# Patient Record
Sex: Male | Born: 1965 | Race: White | Hispanic: No | Smoking: Former smoker
Health system: Southern US, Community
[De-identification: ages and names within clinical notes are randomized; demographics above are authoritative.]

## PROBLEM LIST (undated history)

## (undated) DIAGNOSIS — E785 Hyperlipidemia, unspecified: Secondary | ICD-10-CM

## (undated) DIAGNOSIS — E119 Type 2 diabetes mellitus without complications: Secondary | ICD-10-CM

## (undated) DIAGNOSIS — I219 Acute myocardial infarction, unspecified: Secondary | ICD-10-CM

## (undated) HISTORY — PX: CARDIAC SURGERY: SHX584

---

## 2017-09-27 ENCOUNTER — Other Ambulatory Visit: Payer: Self-pay

## 2017-09-27 ENCOUNTER — Ambulatory Visit (INDEPENDENT_AMBULATORY_CARE_PROVIDER_SITE_OTHER): Payer: Medicare Other

## 2017-09-27 ENCOUNTER — Encounter: Payer: Self-pay | Admitting: Emergency Medicine

## 2017-09-27 ENCOUNTER — Ambulatory Visit
Admission: EM | Admit: 2017-09-27 | Discharge: 2017-09-27 | Disposition: A | Discharge status: Home / Self Care | Payer: Medicare Other

## 2017-09-27 DIAGNOSIS — L03115 Cellulitis of right lower limb: Secondary | ICD-10-CM | POA: Diagnosis not present

## 2017-09-27 DIAGNOSIS — M25561 Pain in right knee: Secondary | ICD-10-CM

## 2017-09-27 DIAGNOSIS — W19XXXA Unspecified fall, initial encounter: Secondary | ICD-10-CM | POA: Diagnosis not present

## 2017-09-27 DIAGNOSIS — S8001XA Contusion of right knee, initial encounter: Secondary | ICD-10-CM

## 2017-09-27 HISTORY — DX: Acute myocardial infarction, unspecified: I21.9

## 2017-09-27 HISTORY — DX: Type 2 diabetes mellitus without complications: E11.9

## 2017-09-27 MED ORDER — MUPIROCIN 2 % EX OINT: TOPICAL_OINTMENT | CUTANEOUS | 0 refills | Status: AC

## 2017-09-27 MED ORDER — CEPHALEXIN 500 MG PO CAPS: 500.0000 mg | ORAL_CAPSULE | Freq: Four times a day (QID) | ORAL | 0 refills | Status: AC

## 2017-09-27 NOTE — ED Provider Notes (Addendum)
[facility] ____________________________________________  Time seen: Approximately 8:30 AM  I have reviewed the triage vital signs and the nursing notes.   HISTORY  Chief Complaint Knee Pain  HPI Eric Maldonado is a 52 y.o. male presenting for evaluation of right anterior knee pain that is been present since Monday after he tripped and fell forward.  Patient states when he landed he landed with his right knee on a pile of wood causing bruising and pain.  Reports has continued to remain ambulatory.  Has intermittently taken over-the-counter Tylenol which does help for pain.  Patient states that he had a few scratches to his right lower leg from when he hit the wood.  Denies any increase to his chronic lower leg swelling.  States right knee pain is mostly with direct palpation. States pain only to anterior knee, denies other leg pain.  Has continued to ambulate well.  Denies any sensation of knee giving out or instability.  States normally has a reddish color to bilateral lower legs.States that he knows he has arthritis to his left knee.  Denies pain radiation, paresthesias, acute leg swelling, dizziness, chest pain, shortness of breath, or other complaints.  Reports otherwise feels well.  Denies other aggravating or alleviating factors. Denies recent sickness. Denies recent antibiotic use.  Reports tetanus immunization is up-to-date. Denies history of MRSA.   Ofilia Neas, MD: PCP   Past Medical History:  Diagnosis Date  . Diabetes mellitus without complication (HCC)   . Myocardial infarct (HCC)     There are no active problems to display for this patient.   Past Surgical History:  Procedure Laterality Date  . CARDIAC SURGERY       No current facility-administered medications for this encounter.   Current Outpatient Medications:  .  aspirin 325 MG EC tablet, Take 325 mg by mouth daily., Disp: , Rfl:  .  metFORMIN (GLUCOPHAGE) 1000 MG tablet, Take 1,000 mg by mouth 2  (two) times daily with a meal., Disp: , Rfl:  .  cephALEXin (KEFLEX) 500 MG capsule, Take 1 capsule (500 mg total) by mouth 4 (four) times daily for 7 days., Disp: 28 capsule, Rfl: 0 .  mupirocin ointment (BACTROBAN) 2 %, Apply two times a day for 7 days., Disp: 22 g, Rfl: 0  Allergies Patient has no known allergies.  Family History  Problem Relation Age of Onset  . Diabetes Mother   . Heart failure Father   . Seizures Father     Social History Social History   Tobacco Use  . Smoking status: Former Games developer  . Smokeless tobacco: Never Used  Substance Use Topics  . Alcohol use: No    Frequency: Never  . Drug use: Not on file    Review of Systems Constitutional: No fever/chills Cardiovascular: Denies chest pain. Respiratory: Denies shortness of breath. Gastrointestinal: No abdominal pain.  Musculoskeletal: Negative for back pain. As above.  Skin: Negative for rash.   ____________________________________________   PHYSICAL EXAM:  VITAL SIGNS: ED Triage Vitals  Enc Vitals Group     BP 09/27/17 0827 117/74     Pulse Rate 09/27/17 0827 67     Resp 09/27/17 0827 18     Temp 09/27/17 0827 98.5 F (36.9 C)     Temp Source 09/27/17 0827 Oral     SpO2 09/27/17 0827 96 %     Weight 09/27/17 0823 (!) 350 lb (158.8 kg)     Height 09/27/17 0823 6' (1.829 m)  Head Circumference --      Peak Flow --      Pain Score 09/27/17 0823 7     Pain Loc --      Pain Edu? --      Excl. in GC? --     Constitutional: Alert and oriented. Well appearing and in no acute distress. Cardiovascular: Normal rate, regular rhythm. Grossly normal heart sounds.  Good peripheral circulation. Respiratory: Normal respiratory effort without tachypnea nor retractions. Breath sounds are clear and equal bilaterally. No wheezes, rales, rhonchi. Musculoskeletal: Bilateral pedal pulses equal and easily palpated. Except: Right anterior knee mild to moderate ecchymosis and diffuse mild tenderness, mild  localized swelling and tenderness along anterior patella, no anterior or posterior drawer pain, no valgus or varus pain or laxity, able to fully extend and flex.  Patient with bilateral chronic lower extremity dusky ruddish color with right anterior lower shin superficial abrasions with mild surrounding erythema, no fluctuance, no induration, bilateral lower extremities edematous and obese and appear equal in size, patient denies acute swelling changes. No calf tenderness bilaterally. Right lower extremity otherwise nontender.  No paresthesias noted. Neurologic:  Normal speech and language. No gross focal neurologic deficits are appreciated. Speech is normal. No gait instability.  Skin:  Skin is warm, dry. As above. Psychiatric: Mood and affect are normal. Speech and behavior are normal. Patient exhibits appropriate insight and judgment   ___________________________________________   LABS (all labs ordered are listed, but only abnormal results are displayed)  Labs Reviewed - No data to display ____________________________________________   RADIOLOGY  Dg Knee Complete 4 Views Right  Result Date: 09/27/2017 CLINICAL DATA:  Tripped and fell 3 days ago landing on wood pile with significant bruising of the peripatellar region. History of chronic lower extremity cellulitis, obesity, diabetes, former smoker. EXAM: RIGHT KNEE - COMPLETE 4+ VIEW COMPARISON:  None in PACs FINDINGS: The bones are subjectively adequately mineralized. There is no acute fracture or dislocation. There is moderate narrowing of the medial joint compartment. There beaking of the tibial spines. There is spurring of the periphery of the medial femoral condyle and medial tibial plateau. Spurs arise from the articular margins of the patella. No acute patellar fracture is noted. There is mild subluxation laterally of the PET of the patella with respect to the femur likely due to degenerative changes. 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 No acute fracture nor dislocation. Electronically Signed   By: David  SwazilandJordan M.D.   On: 09/27/2017 09:03   ____________________________________________   PROCEDURES Procedures   INITIAL IMPRESSION / ASSESSMENT AND PLAN / ED COURSE  Pertinent labs & imaging results that were available during my care of the patient were reviewed by me and considered in my medical decision making (see chart for details).  Well-appearing patient.  Patient is morbidly obese with chronic bilateral lower extremity venous insufficiency by appearance of coloration.  Patient denies any acute swelling or pain to lower extremities except for right anterior knee.  Right knee x-ray as above per radiologist, moderate osteoarthritic changes, no fracture or dislocatio suspect right knee contusion injuries.  Also concern for onset of right lower extremity cellulitis.  And will treat with oral Keflex.  Patient denies need for pain medication.  Encourage rest, ice, supportive care.  Follow with PCP orthopedic as needed for continued complaints.  Discussed follow up with Primary care physician this week. Discussed follow up and return parameters including no resolution or any worsening concerns. Patient verbalized  understanding and agreed to plan.   ____________________________________________   FINAL CLINICAL IMPRESSION(S) / ED DIAGNOSES  Final diagnoses:  Contusion of right knee, initial encounter  Cellulitis of leg, right     ED Discharge Orders        Ordered    cephALEXin (KEFLEX) 500 MG capsule  4 times daily     09/27/17 0927    mupirocin ointment (BACTROBAN) 2 %     09/27/17 1000       Note: This dictation was prepared with Dragon dictation along with smaller phrase technology. Any transcriptional errors that result from this process are unintentional.         Renford Dills, NP 09/27/17 1004

## 2017-09-27 NOTE — Discharge Instructions (Signed)
Take medication as prescribed. Rest. Elevate.   Follow up with your primary care physician or orthopedic this week as needed. Return to Urgent care for new or worsening concerns.

## 2017-09-27 NOTE — ED Triage Notes (Signed)
Patient states that he tripped over a wood pile and fell on his right knee on Monday.  Patient c/o pain in his right knee.

## 2017-10-06 ENCOUNTER — Encounter: Payer: Self-pay | Admitting: Emergency Medicine

## 2017-10-06 ENCOUNTER — Emergency Department
Admission: EM | Admit: 2017-10-06 | Discharge: 2017-10-06 | Disposition: A | Discharge status: Home / Self Care | Payer: Medicare Other | Attending: Emergency Medicine | Admitting: Emergency Medicine

## 2017-10-06 ENCOUNTER — Other Ambulatory Visit: Payer: Self-pay

## 2017-10-06 ENCOUNTER — Emergency Department: Payer: Medicare Other

## 2017-10-06 DIAGNOSIS — S82091A Other fracture of right patella, initial encounter for closed fracture: Secondary | ICD-10-CM | POA: Diagnosis not present

## 2017-10-06 DIAGNOSIS — Z7984 Long term (current) use of oral hypoglycemic drugs: Secondary | ICD-10-CM | POA: Insufficient documentation

## 2017-10-06 DIAGNOSIS — Y998 Other external cause status: Secondary | ICD-10-CM | POA: Diagnosis not present

## 2017-10-06 DIAGNOSIS — S8991XA Unspecified injury of right lower leg, initial encounter: Secondary | ICD-10-CM | POA: Diagnosis present

## 2017-10-06 DIAGNOSIS — Z87891 Personal history of nicotine dependence: Secondary | ICD-10-CM | POA: Diagnosis not present

## 2017-10-06 DIAGNOSIS — Y939 Activity, unspecified: Secondary | ICD-10-CM | POA: Insufficient documentation

## 2017-10-06 DIAGNOSIS — Z7982 Long term (current) use of aspirin: Secondary | ICD-10-CM | POA: Diagnosis not present

## 2017-10-06 DIAGNOSIS — M25461 Effusion, right knee: Secondary | ICD-10-CM | POA: Diagnosis not present

## 2017-10-06 DIAGNOSIS — W0110XA Fall on same level from slipping, tripping and stumbling with subsequent striking against unspecified object, initial encounter: Secondary | ICD-10-CM | POA: Diagnosis not present

## 2017-10-06 DIAGNOSIS — Z7902 Long term (current) use of antithrombotics/antiplatelets: Secondary | ICD-10-CM | POA: Diagnosis not present

## 2017-10-06 DIAGNOSIS — Y929 Unspecified place or not applicable: Secondary | ICD-10-CM | POA: Insufficient documentation

## 2017-10-06 DIAGNOSIS — I252 Old myocardial infarction: Secondary | ICD-10-CM | POA: Diagnosis not present

## 2017-10-06 DIAGNOSIS — E119 Type 2 diabetes mellitus without complications: Secondary | ICD-10-CM | POA: Diagnosis not present

## 2017-10-06 DIAGNOSIS — Z79899 Other long term (current) drug therapy: Secondary | ICD-10-CM | POA: Diagnosis not present

## 2017-10-06 DIAGNOSIS — Z7901 Long term (current) use of anticoagulants: Secondary | ICD-10-CM | POA: Insufficient documentation

## 2017-10-06 LAB — BASIC METABOLIC PANEL
Anion gap: 10 (ref 5–15)
BUN: 17 mg/dL (ref 6–20)
CHLORIDE: 103 mmol/L (ref 101–111)
CO2: 23 mmol/L (ref 22–32)
CREATININE: 0.79 mg/dL (ref 0.61–1.24)
Calcium: 8.8 mg/dL — ABNORMAL LOW (ref 8.9–10.3)
GFR calc non Af Amer: >60 mL/min (ref >60)
GLUCOSE: 165 mg/dL — AB (ref 65–99)
Potassium: 4.2 mmol/L (ref 3.5–5.1)
Sodium: 136 mmol/L (ref 135–145)

## 2017-10-06 LAB — CBC WITH DIFFERENTIAL/PLATELET
BASOS PCT: 1 %
Basophils Absolute: 0 10*3/uL (ref 0–0.1)
Eosinophils Absolute: 0.3 10*3/uL (ref 0–0.7)
Eosinophils Relative: 4 %
HEMATOCRIT: 44.1 % (ref 40.0–52.0)
HEMOGLOBIN: 14.4 g/dL (ref 13.0–18.0)
LYMPHS ABS: 1.8 10*3/uL (ref 1.0–3.6)
LYMPHS PCT: 21 %
MCH: 28.2 pg (ref 26.0–34.0)
MCHC: 32.7 g/dL (ref 32.0–36.0)
MCV: 86.1 fL (ref 80.0–100.0)
MONO ABS: 0.8 10*3/uL (ref 0.2–1.0)
Monocytes Relative: 9 %
NEUTROS ABS: 5.9 10*3/uL (ref 1.4–6.5)
NEUTROS PCT: 65 %
Platelets: 190 10*3/uL (ref 150–440)
RBC: 5.12 MIL/uL (ref 4.40–5.90)
RDW: 15.3 % — ABNORMAL HIGH (ref 11.5–14.5)
WBC: 8.9 10*3/uL (ref 3.8–10.6)

## 2017-10-06 LAB — PROTIME-INR
INR: 0.99
PROTHROMBIN TIME: 13 s (ref 11.4–15.2)

## 2017-10-06 MED ORDER — OXYCODONE-ACETAMINOPHEN 5-325 MG PO TABS
1.0000 | ORAL_TABLET | ORAL | 0 refills | Status: AC | PRN
Start: 2017-10-06 — End: ?

## 2017-10-06 MED ORDER — OXYCODONE-ACETAMINOPHEN 5-325 MG PO TABS
1.0000 | ORAL_TABLET | Freq: Once | ORAL | Status: AC
  Administered 2017-10-06: 1 via ORAL
  Filled 2017-10-06: qty 1

## 2017-10-06 NOTE — ED Notes (Signed)
AAOx3.  Skin warm and dry.  NAD 

## 2017-10-06 NOTE — ED Notes (Signed)
Pt getting portable knee xray at this time.

## 2017-10-06 NOTE — ED Triage Notes (Signed)
Patient ambulatory to triage with steady gait, without difficulty or distress noted; pt reports falling on right knee 2wks ago; seen at urgent care and told he had bruising but pain persists

## 2017-10-06 NOTE — ED Provider Notes (Addendum)
[facility] Emergency Department Provider Note  ____________________________________________   First MD Initiated Contact with Patient 10/06/17 0703     (approximate)  I have reviewed the triage vital signs and the nursing notes.   HISTORY  Chief Complaint Knee Injury   HPI Eric Maldonado is a 52 y.o. male with a history of diabetes, myocardial infarction on Coumadin who is presenting to the emergency department today with right knee swelling.  He says that he tripped and fell on the 23rd hitting the anterior aspect of his right knee.  He went to urgent care at that time where he had an x-ray which did not show fracture.  He has been taking Tylenol ever since which he says has helped with his pain.  He says that his pain was improving until 2 days ago when he began to have worsening pain and swelling to the right knee.  He says that he was unable to sleep last night because the pain and is unable to range the knee because of the pain.  He states there is redness over the knee but this is been persistent since the injury and actually is improving from when it was black and blue.  Patient takes Coumadin.  Says that he took his last dose yesterday morning.   Past Medical History:  Diagnosis Date  . Diabetes mellitus without complication (HCC)   . Myocardial infarct (HCC)     There are no active problems to display for this patient.   Past Surgical History:  Procedure Laterality Date  . CARDIAC SURGERY      Prior to Admission medications   Medication Sig Start Date End Date Taking? Authorizing Provider  aspirin 325 MG EC tablet Take 325 mg by mouth daily.   Yes [provider]  atorvastatin (LIPITOR) 80 MG tablet Take 80 mg by mouth daily. 08/20/17  Yes [provider]  clopidogrel (PLAVIX) 75 MG tablet Take 75 mg by mouth daily. 11/02/15  Yes [provider]  gabapentin (NEURONTIN) 300 MG capsule Take 1,200 mg by mouth at bedtime.  09/15/17  Yes [provider]  losartan (COZAAR) 25 MG tablet Take 25 mg by mouth daily. 11/02/15  Yes [provider]  metFORMIN (GLUCOPHAGE) 1000 MG tablet Take 1,000 mg by mouth 2 (two) times daily with a meal.   Yes [provider]  nitroGLYCERIN (NITROSTAT) 0.4 MG SL tablet Place under the tongue. 11/02/15  Yes [provider]  pioglitazone (ACTOS) 30 MG tablet Take 30 mg by mouth daily. 03/30/17  Yes [provider]  sotalol (BETAPACE) 80 MG tablet Take 80 mg by mouth 2 (two) times daily. 02/06/17 02/06/18 Yes [provider]  triamcinolone cream (KENALOG) 0.1 % 1 application topically BID to affected areas. 02/27/17  Yes [provider]  warfarin (COUMADIN) 5 MG tablet Take 5 mg by mouth daily.   Yes [provider]  mupirocin ointment (BACTROBAN) 2 % Apply two times a day for 7 days. Patient not taking: Reported on 10/06/2017 09/27/17   Renford DillsMiller, Lindsey, NP    Allergies Patient has no known allergies.  Family History  Problem Relation Age of Onset  . Diabetes Mother   . Heart failure Father   . Seizures Father     Social History Social History   Tobacco Use  . Smoking status: Former Games developermoker  . Smokeless tobacco: Never Used  Substance Use Topics  . Alcohol use: No    Frequency: Never  . Drug  use: Not on file    Review of Systems  Constitutional: No fever/chills Eyes: No visual changes. ENT: No sore throat. Cardiovascular: Denies chest pain. Respiratory: Denies shortness of breath. Gastrointestinal: No abdominal pain.  No nausea, no vomiting.  No diarrhea.  No constipation. Genitourinary: Negative for dysuria. Musculoskeletal: Negative for back pain. Skin: Negative for rash. Neurological: Negative for headaches, focal weakness or numbness.   ____________________________________________   PHYSICAL EXAM:  VITAL SIGNS: ED Triage Vitals  Enc Vitals Group     BP 10/06/17 0616 (!) 149/79     Pulse Rate  10/06/17 0616 78     Resp 10/06/17 0616 20     Temp 10/06/17 0616 98.9 F (37.2 C)     Temp Source 10/06/17 0616 Oral     SpO2 10/06/17 0616 96 %     Weight 10/06/17 0610 (!) 360 lb (163.3 kg)     Height 10/06/17 0610 6' (1.829 m)     Head Circumference --      Peak Flow --      Pain Score 10/06/17 0609 8     Pain Loc --      Pain Edu? --      Excl. in GC? --     Constitutional: Alert and oriented. Well appearing and in no acute distress. Eyes: Conjunctivae are normal.  Head: Atraumatic. Nose: No congestion/rhinnorhea. Mouth/Throat: Mucous membranes are moist.  Neck: No stridor.   Cardiovascular: Normal rate, regular rhythm. Grossly normal heart sounds.   Respiratory: Normal respiratory effort.  No retractions. Lungs CTAB. Gastrointestinal: Soft and nontender. No distention.  Musculoskeletal: Right lower extremity with anterior patellar tenderness to palpation where there is a palpable effusion surrounding the patella.  The patient is very tender to the knee anteriorly but does not of any lateral tenderness nor does he have any posterior tenderness.  He has pain to the right knee on active range of motion.  There is overlying erythema but the patient says this is from the initial injury.  The knee is not warm and is without any induration. Neurologic:  Normal speech and language. No gross focal neurologic deficits are appreciated. Skin:  Skin is warm, dry and intact. No rash noted. Psychiatric: Mood and affect are normal. Speech and behavior are normal.  ____________________________________________   LABS (all labs ordered are listed, but only abnormal results are displayed)  Labs Reviewed  CBC WITH DIFFERENTIAL/PLATELET - Abnormal; Notable for the following components:      Result Value   RDW 15.3 (*)    All other components within normal limits  BASIC METABOLIC PANEL - Abnormal; Notable for the following components:   Glucose, Bld 165 (*)    Calcium 8.8 (*)    All other  components within normal limits  PROTIME-INR   ____________________________________________  EKG   ____________________________________________  RADIOLOGY  Patellar chip fracture found on this x-ray. ____________________________________________   PROCEDURES  Procedure(s) performed:   Procedures  Critical Care performed:   ____________________________________________   INITIAL IMPRESSION / ASSESSMENT AND PLAN / ED COURSE  Pertinent labs & imaging results that were available during my care of the patient were reviewed by me and considered in my medical decision making (see chart for details).  DDX:  Knee effusion, septic joint, inflammatory arthritis, ligamentous injury, meniscous injury As part of my medical decision making, I reviewed the following data within the electronic MEDICAL RECORD NUMBER Notes from prior ED visits  ----------------------------------------- 7:34 AM on 10/06/2017 -----------------------------------------  Discussed the case  with Dr. Odis Luster of orthopedics who says that the patient is to have his INR checked for spontaneous bleeding.  However, with INR is normal he does recommend arthrocentesis.  Dr. Nechama Guard says that he has seen patients with effusions or hematomas days to weeks out from injuries.  However, he does recommend follow-up in the office.  Patient aware of the plan.  Checking labs at this time.    ----------------------------------------- 9:04 AM on 10/06/2017 -----------------------------------------  Patient at this time resting without distress.  Updated about fracture found on x-ray today.  Likely nondisplaced fracture initially that is now displaced associated with effusion.  Patient will be placed in a knee immobilizer and given crutches.  He will also be given a prescription for Percocet and follow-up with orthopedics.  He is understanding of this plan and willing to comply. ____________________________________________   FINAL  CLINICAL IMPRESSION(S) / ED DIAGNOSES  Patellar fracture.    NEW MEDICATIONS STARTED DURING THIS VISIT:  New Prescriptions   No medications on file     Note:  This document was prepared using Dragon voice recognition software and may include unintentional dictation errors.     Myrna Blazer, MD 10/06/17 [id number]  Due to the patient's body habitus he was unable to fit into a knee immobilizer and was over the weight limit for crutches.  I discussed this with Dr. Odis Luster.  We placed the patient in a bulky Ace wrap dressing for stability to the right knee and he will be given a prescription for a walker.    Myrna Blazer, MD 10/06/17 [id number]

## 2017-10-09 ENCOUNTER — Encounter: Payer: Self-pay | Admitting: Emergency Medicine

## 2017-10-09 ENCOUNTER — Ambulatory Visit
Admission: EM | Admit: 2017-10-09 | Discharge: 2017-10-09 | Disposition: A | Discharge status: Hospice | Payer: Medicare Other | Attending: Family Medicine | Admitting: Family Medicine

## 2017-10-09 ENCOUNTER — Other Ambulatory Visit: Payer: Self-pay

## 2017-10-09 ENCOUNTER — Emergency Department
Admission: EM | Admit: 2017-10-09 | Discharge: 2017-10-09 | Discharge status: Against Medical Advice | Payer: Medicare Other | Attending: Emergency Medicine | Admitting: Emergency Medicine

## 2017-10-09 DIAGNOSIS — M25569 Pain in unspecified knee: Secondary | ICD-10-CM | POA: Insufficient documentation

## 2017-10-09 DIAGNOSIS — L03115 Cellulitis of right lower limb: Secondary | ICD-10-CM

## 2017-10-09 DIAGNOSIS — Z5321 Procedure and treatment not carried out due to patient leaving prior to being seen by health care provider: Secondary | ICD-10-CM | POA: Diagnosis not present

## 2017-10-09 DIAGNOSIS — S82001G Unspecified fracture of right patella, subsequent encounter for closed fracture with delayed healing: Secondary | ICD-10-CM

## 2017-10-09 HISTORY — DX: Hyperlipidemia, unspecified: E78.5

## 2017-10-09 LAB — BASIC METABOLIC PANEL
ANION GAP: 11 (ref 5–15)
BUN: 23 mg/dL — AB (ref 6–20)
CALCIUM: 9.2 mg/dL (ref 8.9–10.3)
CO2: 24 mmol/L (ref 22–32)
Chloride: 98 mmol/L — ABNORMAL LOW (ref 101–111)
Creatinine, Ser: 1.21 mg/dL (ref 0.61–1.24)
GFR calc Af Amer: >60 mL/min (ref >60)
GFR calc non Af Amer: >60 mL/min (ref >60)
GLUCOSE: 121 mg/dL — AB (ref 65–99)
POTASSIUM: 4.6 mmol/L (ref 3.5–5.1)
Sodium: 133 mmol/L — ABNORMAL LOW (ref 135–145)

## 2017-10-09 LAB — CBC WITH DIFFERENTIAL/PLATELET
BASOS ABS: 0 10*3/uL (ref 0–0.1)
Basophils Relative: 0 %
Eosinophils Absolute: 0.3 10*3/uL (ref 0–0.7)
Eosinophils Relative: 2 %
HEMATOCRIT: 41.5 % (ref 40.0–52.0)
Hemoglobin: 13.6 g/dL (ref 13.0–18.0)
LYMPHS ABS: 1.4 10*3/uL (ref 1.0–3.6)
LYMPHS PCT: 12 %
MCH: 27.8 pg (ref 26.0–34.0)
MCHC: 32.8 g/dL (ref 32.0–36.0)
MCV: 84.7 fL (ref 80.0–100.0)
MONO ABS: 1.1 10*3/uL — AB (ref 0.2–1.0)
MONOS PCT: 9 %
NEUTROS ABS: 9.5 10*3/uL — AB (ref 1.4–6.5)
Neutrophils Relative %: 77 %
Platelets: 267 10*3/uL (ref 150–440)
RBC: 4.9 MIL/uL (ref 4.40–5.90)
RDW: 14.5 % (ref 11.5–14.5)
WBC: 12.4 10*3/uL — ABNORMAL HIGH (ref 3.8–10.6)

## 2017-10-09 NOTE — Discharge Instructions (Signed)
-  Go to the ER for further evaluation. Will likely need IV antibiotic therapy.

## 2017-10-09 NOTE — ED Triage Notes (Addendum)
Patient in today c/o right knee pain, swelling and redness. Patient states he was seen at [facility] ED on 10/06/17 and told he had a chipped knee cap. Patient states the knee is worsening. Patient states he has an appointment with [facility] on Thursday 10/12/17.

## 2017-10-09 NOTE — ED Triage Notes (Addendum)
Seen on Friday for a chipped knee cap.  States knee is worsening, knee is reddened.  Top of knee is reddened and warm tot touch.

## 2017-10-09 NOTE — ED Provider Notes (Signed)
[facility]    CSN: [id number] Arrival date & time: 10/09/17  1809     History   Chief Complaint Chief Complaint  Patient presents with  . Knee Pain    HPI Eric Maldonado is a 52 y.o. male.   Patient is a 52 year old male who presents with complaint of right knee pain.  Patient was initially seen here on January 23 after falling onto a wood pile, landing on his right knee.  An x-ray done at that time was negative for a fracture.  Patient is given prescription for Keflex and discharge.  Patient then re-presented to the emergency room on February 1 with worsening pain and swelling.  A x-ray at that time showed a small displaced fracture of the posterior side of the patella.  Patient was given a prescription for Percocet and had a his knee wrapped in a Ace bandage because he would not fit into a immobilizer.  He was referred to [facility] for follow-up and has an appointment on the seventh, this Thursday.  Patient states he went back to the ER today and was seen by triage but left after not being seen for 3 hours.  Patient did have some lab work done.  White count of 12.4, up from 8.9 on the first.  Patient also states that pus has been draining from the knee as well.        Past Medical History:  Diagnosis Date  . Diabetes mellitus without complication (HCC)   . Hyperlipidemia   . Myocardial infarct (HCC)     There are no active problems to display for this patient.   Past Surgical History:  Procedure Laterality Date  . CARDIAC SURGERY     stent placement       Home Medications    Prior to Admission medications   Medication Sig Start Date End Date Taking? Authorizing Provider  aspirin 325 MG EC tablet Take 325 mg by mouth daily.   Yes [provider]  atorvastatin (LIPITOR) 80 MG tablet Take 80 mg by mouth daily. 08/20/17  Yes [provider]  gabapentin (NEURONTIN) 300 MG capsule Take 1,200 mg by mouth at bedtime. 09/15/17  Yes [provider]  losartan (COZAAR) 25 MG tablet Take 25 mg by mouth daily. 11/02/15  Yes [provider]  metFORMIN (GLUCOPHAGE) 1000 MG tablet Take 1,000 mg by mouth 2 (two) times daily with a meal.   Yes [provider]  oxyCODONE-acetaminophen (PERCOCET) 5-325 MG tablet Take 1-2 tablets by mouth every 4 (four) hours as needed for moderate pain or severe pain. 10/06/17  Yes Schaevitz, Myra Rude, MD  pioglitazone (ACTOS) 30 MG tablet Take 30 mg by mouth daily. 03/30/17  Yes [provider]  sotalol (BETAPACE) 80 MG tablet Take 80 mg by mouth 2 (two) times daily. 02/06/17 02/06/18 Yes [provider]  triamcinolone cream (KENALOG) 0.1 % 1 application topically BID to affected areas. 02/27/17  Yes [provider]  warfarin (COUMADIN) 5 MG tablet Take 5 mg by mouth daily.   Yes [provider]  clopidogrel (PLAVIX) 75 MG tablet Take 75 mg by mouth daily. 11/02/15   [provider]  mupirocin ointment (BACTROBAN) 2 % Apply two times a day for 7 days. Patient not taking: Reported on 10/06/2017 09/27/17   Renford Dills, NP  nitroGLYCERIN (NITROSTAT) 0.4 MG SL tablet Place under the tongue. 11/02/15   [provider]    Family History Family History  Problem Relation  Age of Onset  . Diabetes Mother   . Heart failure Father   . Seizures Father     Social History Social History   Tobacco Use  . Smoking status: Former Smoker    Last attempt to quit: 10/09/2002    Years since quitting: 15.0  . Smokeless tobacco: Former Neurosurgeon    Quit date: 07/09/2017  Substance Use Topics  . Alcohol use: Yes    Frequency: Never    Comment: rarely  . Drug use: No     Allergies   Patient has no known allergies.   Review of Systems Review of Systems  As noted above in HPI.  Other systems reviewed and found to be negative.   Physical Exam Triage Vital Signs ED Triage Vitals [10/09/17 1838]  Enc Vitals Group     BP 119/69     Pulse Rate  81     Resp 16     Temp 98.8 F (37.1 C)     Temp Source Oral     SpO2 98 %     Weight (!) 350 lb (158.8 kg)     Height 6' (1.829 m)     Head Circumference      Peak Flow      Pain Score 10     Pain Loc      Pain Edu?      Excl. in GC?    No data found.  Updated Vital Signs BP 119/69 (BP Location: Left Arm)   Pulse 81   Temp 98.8 F (37.1 C) (Oral)   Resp 16   Ht 6' (1.829 m)   Wt (!) 350 lb (158.8 kg)   SpO2 98%   BMI 47.47 kg/m   Visual Acuity  Physical Exam  Constitutional: He appears well-developed. No distress.  Obese  Pulmonary/Chest: Effort normal.  Musculoskeletal:       Right knee: He exhibits erythema. Tenderness found.  Redness across the knee was over towards the lateral edge of the leg.  Patient states initial redness was just over the kneecap itself.  Very tender to minimal touch.  Pale yellow scabbed area for which patient states pus has been draining        UC Treatments / Results  Labs (all labs ordered are listed, but only abnormal results are displayed) Labs Reviewed - No data to display  EKG  EKG Interpretation None       Radiology No results found.  Procedures Procedures (including critical care time)  Medications Ordered in UC Medications - No data to display   Initial Impression / Assessment and Plan / UC Course  I have reviewed the triage vital signs and the nursing notes.  Pertinent labs & imaging results that were available during my care of the patient were reviewed by me and considered in my medical decision making (see chart for details).    X-ray imaging reviewed.  Follow-up knee as above.  Patient has a minimally displaced fracture of the right patella.  Patient now with increased pain redness and swelling with reported puslike drainage from the knee.  Given patient's recent injury.  Known fracture.  Mild elevation in his white count, and his diabetes patient is notably referred to the ER for likely admission and  IV antibiotics..  Final Clinical Impressions(s) / UC Diagnoses   Final diagnoses:  Closed displaced fracture of right patella with delayed healing, unspecified fracture morphology, subsequent encounter  Cellulitis of right knee    ED Discharge Orders  None       Controlled Substance Prescriptions [place] Controlled Substance Registry consulted? Not Applicable   Candis SchatzHarris, Michael D, PA-C 10/09/17 1910

## 2019-12-08 IMAGING — DX DG KNEE COMPLETE 4+V*R*
4 series · 4 of 4 positions shown · non-contrast
Comparison: 09/27/2017.

CLINICAL DATA: Right knee pain and swelling.  Fall 1 week ago.

EXAM:
RIGHT KNEE - COMPLETE 4+ VIEW

[knee ap]
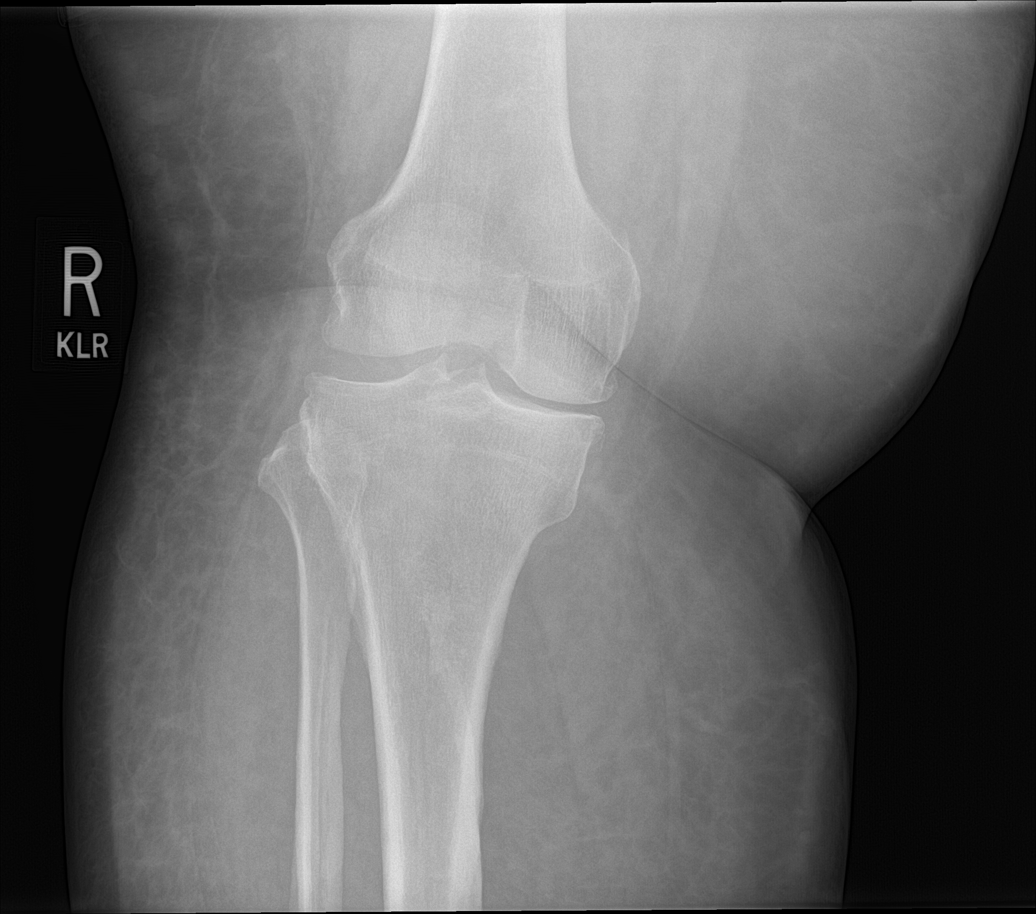

[knee lat]
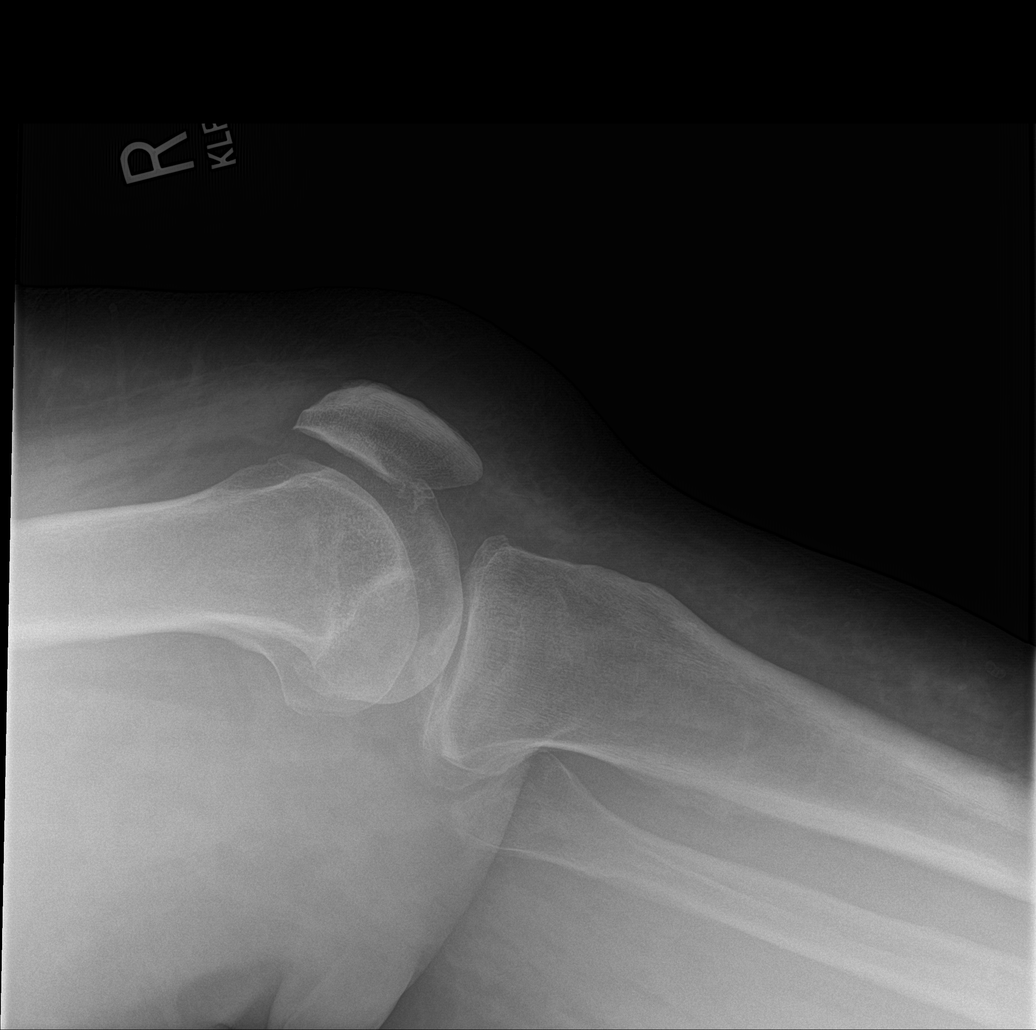

[knee obl (1 of 2)]
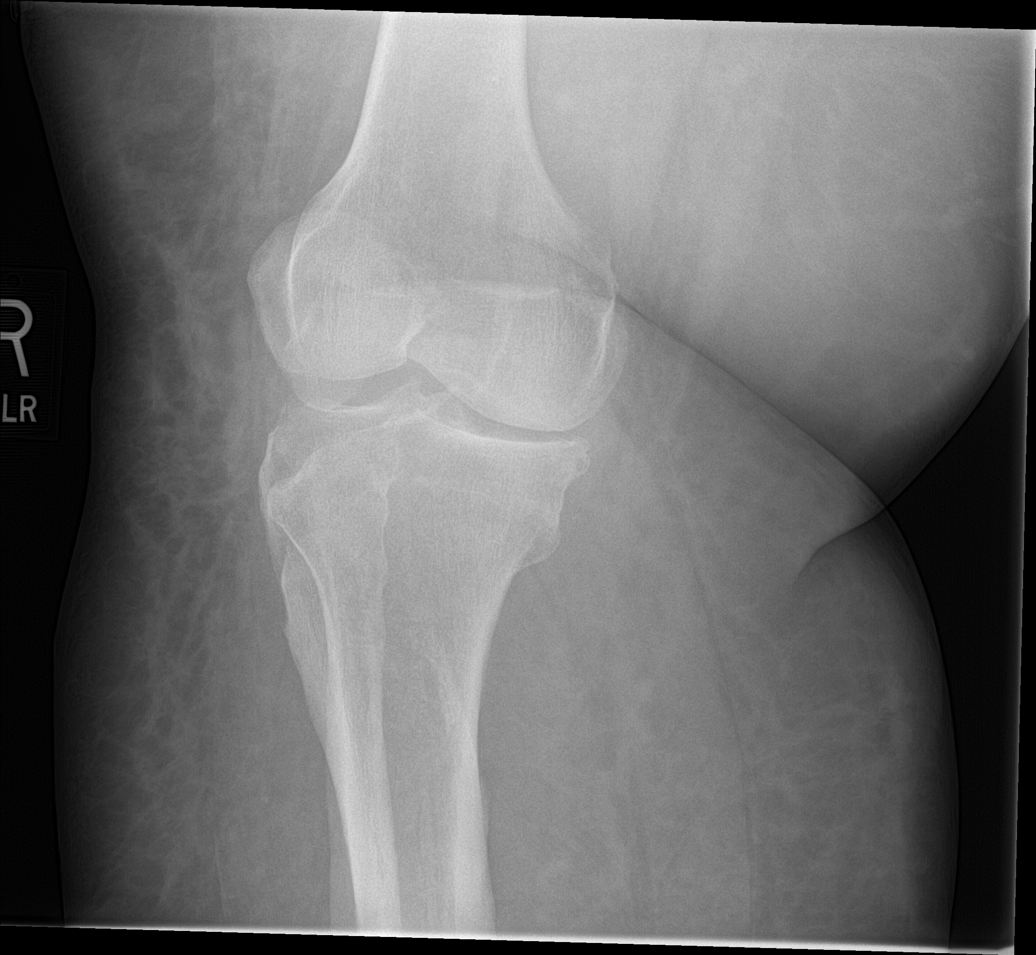

[knee obl (2 of 2)]
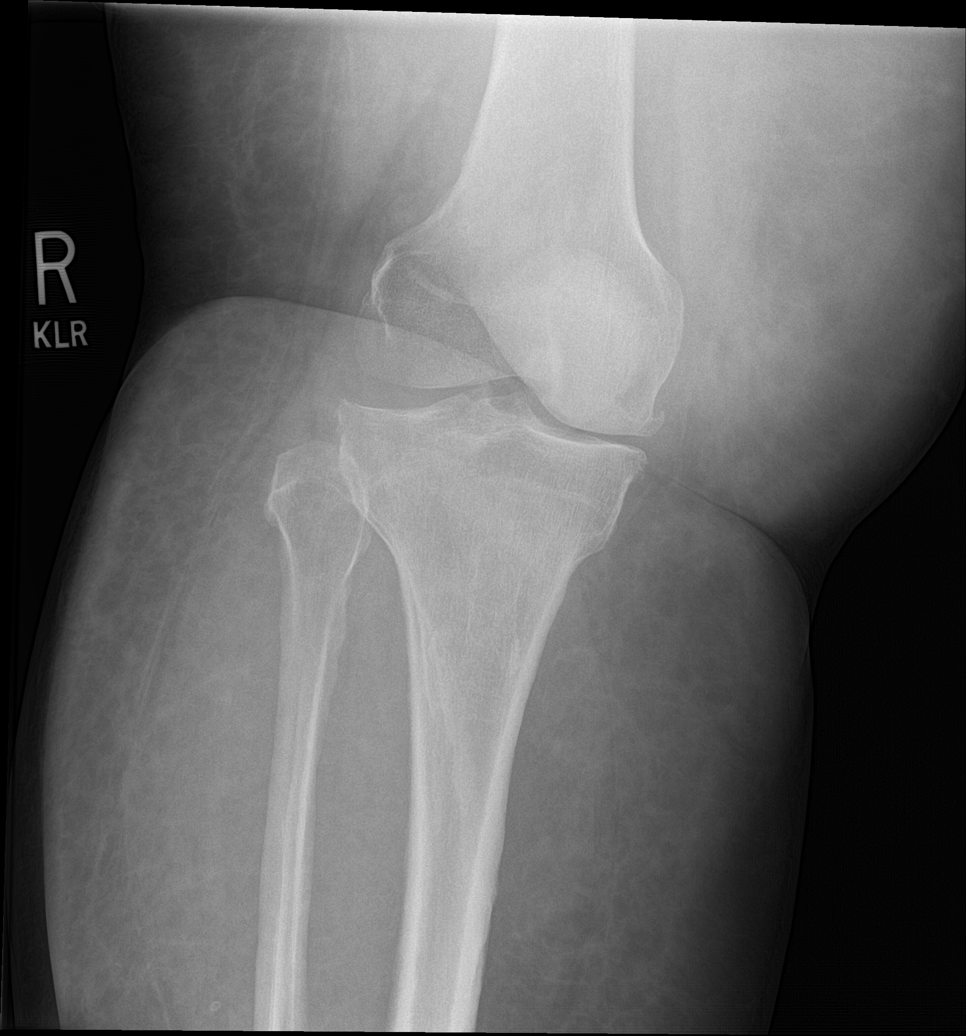

[4 of 4 positions shown; findings below may reference images not displayed]

FINDINGS: A fracture along the inferior posterior aspect of the patella
appears to be present. Slight displacement noted. Patellofemoral
medial compartment degenerative change. No other focal abnormality
identified.
IMPRESSION: 1. Slight displaced fracture of the inferior posterior aspect of the
patella. Age is undetermined.

2.  Medial and patellofemoral compartment degenerative change.
